# Patient Record
Sex: Male | Born: 1975 | Race: Black or African American | Hispanic: No | Marital: Single | Smoking: Current every day smoker
Health system: Southern US, Community
[De-identification: ages and names within clinical notes are randomized; demographics above are authoritative.]

---

## 2004-12-14 ENCOUNTER — Emergency Department (HOSPITAL_COMMUNITY): Admission: EM | Admit: 2004-12-14 | Discharge: 2004-12-14 | Payer: Self-pay | Admitting: Emergency Medicine

## 2005-03-01 ENCOUNTER — Emergency Department: Payer: Self-pay | Admitting: Emergency Medicine

## 2007-09-18 ENCOUNTER — Emergency Department: Payer: Self-pay | Admitting: Emergency Medicine

## 2012-04-30 ENCOUNTER — Emergency Department: Payer: Self-pay | Admitting: Emergency Medicine

## 2012-04-30 LAB — URINALYSIS, COMPLETE
Blood: NEGATIVE
Glucose,UR: NEGATIVE mg/dL (ref 0–75)
Nitrite: NEGATIVE
Protein: NEGATIVE

## 2012-05-01 LAB — URINE CULTURE

## 2016-03-20 ENCOUNTER — Emergency Department: Payer: Self-pay

## 2016-03-20 ENCOUNTER — Encounter: Payer: Self-pay | Admitting: Emergency Medicine

## 2016-03-20 ENCOUNTER — Emergency Department
Admission: EM | Admit: 2016-03-20 | Discharge: 2016-03-20 | Disposition: A | Discharge status: Home / Self Care | Payer: Self-pay | Attending: Student | Admitting: Student

## 2016-03-20 DIAGNOSIS — R0789 Other chest pain: Secondary | ICD-10-CM | POA: Insufficient documentation

## 2016-03-20 DIAGNOSIS — R0602 Shortness of breath: Secondary | ICD-10-CM | POA: Insufficient documentation

## 2016-03-20 DIAGNOSIS — F17200 Nicotine dependence, unspecified, uncomplicated: Secondary | ICD-10-CM | POA: Insufficient documentation

## 2016-03-20 LAB — BASIC METABOLIC PANEL
ANION GAP: 10 (ref 5–15)
BUN: 13 mg/dL (ref 6–20)
CALCIUM: 9.2 mg/dL (ref 8.9–10.3)
CO2: 25 mmol/L (ref 22–32)
CREATININE: 1.03 mg/dL (ref 0.61–1.24)
Chloride: 103 mmol/L (ref 101–111)
GFR calc non Af Amer: >60 mL/min (ref >60)
Glucose, Bld: 85 mg/dL (ref 65–99)
Potassium: 3.8 mmol/L (ref 3.5–5.1)
SODIUM: 138 mmol/L (ref 135–145)

## 2016-03-20 LAB — CBC
HCT: 41 % (ref 40.0–52.0)
Hemoglobin: 14.2 g/dL (ref 13.0–18.0)
MCH: 31.9 pg (ref 26.0–34.0)
MCHC: 34.7 g/dL (ref 32.0–36.0)
MCV: 92.1 fL (ref 80.0–100.0)
PLATELETS: 296 10*3/uL (ref 150–440)
RBC: 4.45 MIL/uL (ref 4.40–5.90)
RDW: 13 % (ref 11.5–14.5)
WBC: 5 10*3/uL (ref 3.8–10.6)

## 2016-03-20 LAB — TROPONIN I

## 2016-03-20 MED ORDER — IBUPROFEN 600 MG PO TABS
600.0000 mg | ORAL_TABLET | Freq: Once | ORAL | Status: AC
  Administered 2016-03-20: 600 mg via ORAL
  Filled 2016-03-20: qty 1

## 2016-03-20 MED ORDER — IBUPROFEN 600 MG PO TABS: 600.0000 mg | ORAL_TABLET | Freq: Three times a day (TID) | ORAL | 0 refills | Status: DC | PRN

## 2016-03-20 NOTE — ED Provider Notes (Signed)
[facility] Emergency Department Provider Note   ____________________________________________   First MD Initiated Contact with Patient 03/20/16 1251     (approximate)  I have reviewed the triage vital signs and the nursing notes.   HISTORY  Chief Complaint Chest Pain    HPI Neil Mitchell is a 40 y.o. male with no chronic medical problems who presents for evaluation of resolved left anterior superior chest wall pain today, noted on awakening this morning at 6:30 AM, initially moderate, now resolved, worse with movement of his neck. Patient reports that when he woke up this morning he was having mild soreness in the left upper chest which he attributed to "sleeping on it wrong". Pain has since resolved. He denies lightheadedness, syncope or presyncope.He denies any associated shortness of breath, the pain was not radiating, not pleuritic. Pain was not associated with any sudden sweating, nausea or vomiting though he did have some shortness of breath when his pain was at its maximum. He denies any family history of early coronary artery disease, no family history of sudden cardiac death. Denies any personal or family history of DVT or PE, exogenous estrogen use, recent surgeries, hemoptysis or recent prolonged period of immobilization.   History reviewed. No pertinent past medical history.  There are no active problems to display for this patient.   History reviewed. No pertinent surgical history.  Prior to Admission medications   Medication Sig Start Date End Date Taking? Authorizing Provider  ibuprofen (ADVIL,MOTRIN) 600 MG tablet Take 1 tablet (600 mg total) by mouth every 8 (eight) hours as needed for moderate pain. 03/20/16   Gayla Doss, MD    Allergies Review of patient's allergies indicates no known allergies.  History reviewed. No pertinent family history.  Social History Social History  Substance Use Topics  . Smoking status: Current  Every Day Smoker  . Smokeless tobacco: Never Used  . Alcohol use Yes    Review of Systems Constitutional: No fever/chills Eyes: No visual changes. ENT: No sore throat. Cardiovascular:+chest pain. Respiratory: Denies shortness of breath. Gastrointestinal: No abdominal pain.  No nausea, no vomiting.  No diarrhea.  No constipation. Genitourinary: Negative for dysuria. Musculoskeletal: Negative for back pain. Skin: Negative for rash. Neurological: Negative for headaches, focal weakness or numbness.  10-point ROS otherwise negative.  ____________________________________________   PHYSICAL EXAM:  VITAL SIGNS: ED Triage Vitals  Enc Vitals Group     BP 03/20/16 1002 116/82     Pulse Rate 03/20/16 1002 61     Resp 03/20/16 1002 20     Temp 03/20/16 1003 98.4 F (36.9 C)     Temp Source 03/20/16 1003 Oral     SpO2 03/20/16 1002 97 %     Weight 03/20/16 0948 143 lb (64.9 kg)     Height 03/20/16 0948 5\' 8"  (1.727 m)     Head Circumference --      Peak Flow --      Pain Score 03/20/16 0948 7     Pain Loc --      Pain Edu? --      Excl. in GC? --     Constitutional: Alert and oriented. Well appearing and in no acute distress. Eyes: Conjunctivae are normal. PERRL. EOMI. Head: Atraumatic. Nose: No congestion/rhinnorhea. Mouth/Throat: Mucous membranes are moist.  Oropharynx non-erythematous. Neck: No stridor.  Supple without meningismus. Cardiovascular: Normal rate, regular rhythm. Grossly normal heart sounds.  Good peripheral circulation. Respiratory: Normal respiratory effort.  No retractions. Lungs  CTAB. Gastrointestinal: Soft and nontender. No distention.  No CVA tenderness. Genitourinary: deferred Musculoskeletal: No lower extremity tenderness nor edema.  No joint effusions. Tenderness to palpation to left superior chest wall, surrounding the clavicle, tenderness to palpation in the left sternocleidomastoid muscles and the trapezius on the left. Neurologic:  Normal speech  and language. No gross focal neurologic deficits are appreciated. No gait instability. Skin:  Skin is warm, dry and intact. No rash noted. Psychiatric: Mood and affect are normal. Speech and behavior are normal.  ____________________________________________   LABS (all labs ordered are listed, but only abnormal results are displayed)  Labs Reviewed  BASIC METABOLIC PANEL  CBC  TROPONIN I   ____________________________________________  EKG  ED ECG REPORT I, Gayla DossGayle, [place] A, the attending physician, personally viewed and interpreted this ECG.   Date: 03/20/2016  EKG Time: 09:46  Rate: 57  Rhythm: sinus bradycardia  Axis: normal  Intervals:none  ST&T Change: No acute ST elevation or acute ST depression. RSR prime in V1 and V2, likely normal variant.  ____________________________________________  RADIOLOGY  CXR   IMPRESSION:  No active cardiopulmonary disease.       ____________________________________________   PROCEDURES  Procedure(s) performed: None  Procedures  Critical Care performed: No  ____________________________________________   INITIAL IMPRESSION / ASSESSMENT AND PLAN / ED COURSE  Pertinent labs & imaging results that were available during my care of the patient were reviewed by me and considered in my medical decision making (see chart for details).  Neil Mitchell is a 40 y.o. male with no chronic medical problems who presents for evaluation of resolved left anterior superior chest wall pain today, noted on awakening this morning at 6:30 AM, resolved. On exam, he is very well-appearing and in no acute distress, vital signs stable and he is afebrile. He appears to have reproducible point tenderness to palpation throughout the left anterior chest wall and I suspect musculoskeletal chest pain. His EKG is reassuring, not consistent with acute ischemia. I discussed the EKG with Dr. Welton FlakesKhan of cardiology who reviewed the EKG and agrees, RSR prime in V2,  not consistent with Brugada. His troponin is negative and was drawn several hours after the onset of this chest pain. Not consistent with ACS. Unremarkable CBC and BMP. Chest x-ray clear. PERC negative, not consistent with PE. Not consistent with acute aortic dissection. Discussed return precautions, need for close PCP follow-up, treatment with anti-inflammatory medications and he is comfortable with the discharge plan. DC home.  Clinical Course     ____________________________________________   FINAL CLINICAL IMPRESSION(S) / ED DIAGNOSES  Final diagnoses:  Chest wall pain      NEW MEDICATIONS STARTED DURING THIS VISIT:  Discharge Medication List as of 03/20/2016  1:43 PM    START taking these medications   Details  ibuprofen (ADVIL,MOTRIN) 600 MG tablet Take 1 tablet (600 mg total) by mouth every 8 (eight) hours as needed for moderate pain., Starting Tue 03/20/2016, Print         Note:  This document was prepared using Dragon voice recognition software and may include unintentional dictation errors.    Gayla DossEryka A Toni Hoffmeister, MD 03/20/16 [phone]

## 2016-03-20 NOTE — ED Triage Notes (Signed)
Pt to ed with c/o left side chest pain, radiating to left shoulder, left arm and left neck.  Pt also reports sob,  reports symptoms worse with movement.

## 2018-04-16 ENCOUNTER — Encounter: Payer: Self-pay | Admitting: *Deleted

## 2018-04-16 ENCOUNTER — Emergency Department
Admission: EM | Admit: 2018-04-16 | Discharge: 2018-04-16 | Disposition: A | Discharge status: Home / Self Care | Payer: Self-pay | Attending: Emergency Medicine | Admitting: Emergency Medicine

## 2018-04-16 ENCOUNTER — Other Ambulatory Visit: Payer: Self-pay

## 2018-04-16 DIAGNOSIS — Z711 Person with feared health complaint in whom no diagnosis is made: Secondary | ICD-10-CM

## 2018-04-16 DIAGNOSIS — F17200 Nicotine dependence, unspecified, uncomplicated: Secondary | ICD-10-CM | POA: Insufficient documentation

## 2018-04-16 DIAGNOSIS — Z202 Contact with and (suspected) exposure to infections with a predominantly sexual mode of transmission: Secondary | ICD-10-CM | POA: Insufficient documentation

## 2018-04-16 LAB — CHLAMYDIA/NGC RT PCR (ARMC ONLY)
CHLAMYDIA TR: NOT DETECTED
N gonorrhoeae: NOT DETECTED

## 2018-04-16 MED ORDER — METRONIDAZOLE 500 MG PO TABS: 2000.0000 mg | ORAL_TABLET | Freq: Once | ORAL | 0 refills | Status: AC

## 2018-04-16 NOTE — ED Provider Notes (Signed)
[facility] Emergency Department Provider Note ____________________________________________  Time seen: 1548  I have reviewed the triage vital signs and the nursing notes.  HISTORY  Chief Complaint  Exposure to STD  HPI Neil AmisBobby K Crespi is a 42 y.o. male who presents himself to the ED for evaluation of STD exposure. He reports that his girlfriend informed him today, that she had trichomoniasis. He denies any dysuria, penile discharge, hematuria, or penile lesions.   No past medical history on file.  There are no active problems to display for this patient.  No past surgical history on file.  Prior to Admission medications   Medication Sig Start Date End Date Taking? Authorizing Provider  ibuprofen (ADVIL,MOTRIN) 600 MG tablet Take 1 tablet (600 mg total) by mouth every 8 (eight) hours as needed for moderate pain. 03/20/16   Gayla DossGayle, Eryka A, MD  metroNIDAZOLE (FLAGYL) 500 MG tablet Take 4 tablets (2,000 mg total) by mouth once for 1 dose. 04/16/18 04/16/18  Elease Swarm, Charlesetta IvoryJenise V Bacon, PA-C   Allergies Patient has no known allergies.  No family history on file.  Social History Social History   Tobacco Use  . Smoking status: Current Every Day Smoker  . Smokeless tobacco: Never Used  Substance Use Topics  . Alcohol use: Yes  . Drug use: No   Review of Systems  Constitutional: Negative for fever. Eyes: Negative for visual changes. ENT: Negative for sore throat. Cardiovascular: Negative for chest pain. Respiratory: Negative for shortness of breath. Gastrointestinal: Negative for abdominal pain, vomiting and diarrhea. Genitourinary: Negative for dysuria, discharge, or penile lesions.  Musculoskeletal: Negative for back pain. Skin: Negative for rash. Neurological: Negative for headaches, focal weakness or numbness. ____________________________________________  PHYSICAL EXAM:  VITAL SIGNS: ED Triage Vitals [04/16/18 1534]  Enc Vitals Group     BP  135/90     Pulse Rate 73     Resp 20     Temp 98.6 F (37 C)     Temp Source Oral     SpO2 99 %     Weight 145 lb (65.8 kg)     Height 5\' 9"  (1.753 m)     Head Circumference      Peak Flow      Pain Score 0     Pain Loc      Pain Edu?      Excl. in GC?    Constitutional: Alert and oriented. Well appearing and in no distress. Head: Normocephalic and atraumatic. Gastroenterology: Abdomen soft and nontender. Normal bowel sounds.  GU: Deferred Cardiovascular: Normal rate, regular rhythm. Normal distal pulses. Respiratory: Normal respiratory effort. No wheezes/rales/rhonchi. Gastrointestinal: Soft and nontender. No distention. ____________________________________________   LABS (pertinent positives/negatives)  Labs Reviewed  CHLAMYDIA/NGC RT PCR ([facility] ONLY)  ____________________________________________  PROCEDURES  Procedures ____________________________________________  INITIAL IMPRESSION / ASSESSMENT AND PLAN / ED COURSE  Patient with ED evaluation following a potential STD exposure. He is asymptomatic with a concern for trich. [facility], and will be treated empirically with Flagyl. He will be notified via phone about his pending culture results. He will follow-up with the [facility] for further testing and treatment. He should avoid any sexual contact until he and his partner(s) have been treated and are symptom-free.  ____________________________________________  FINAL CLINICAL IMPRESSION(S) / ED DIAGNOSES  Final diagnoses:  STD exposure  Concern about STD in male without diagnosis     Karmen StabsMenshew, Charlesetta IvoryJenise V Bacon, PA-C 04/16/18 1830    Minna AntisPaduchowski, Kevin, MD  04/16/18 2327  

## 2018-04-16 NOTE — ED Notes (Signed)
Pt informed that we need urine sample to send to lab. Pt states he will notify us when he is able to provide sample

## 2018-04-16 NOTE — ED Triage Notes (Signed)
Pt states girlriend called him today and told him she had trichomonas   Pt here for treatment.  Pt denies penile discharge or urinary sx.

## 2018-04-16 NOTE — Discharge Instructions (Addendum)
Take the antibiotic as directed. You will be notified via phone, of your other pending lab results. Avoid unprotected sexual contact for at least 1 week. Be sure that you and your partner(s) have been treated and are completely symptom-free before any intimate activity. Follow-up with the [facility] Department for further testing and treatment.

## 2018-09-07 ENCOUNTER — Emergency Department
Admission: EM | Admit: 2018-09-07 | Discharge: 2018-09-07 | Disposition: A | Discharge status: Home / Self Care | Payer: Self-pay | Attending: Emergency Medicine | Admitting: Emergency Medicine

## 2018-09-07 ENCOUNTER — Emergency Department: Payer: Self-pay

## 2018-09-07 ENCOUNTER — Encounter: Payer: Self-pay | Admitting: Emergency Medicine

## 2018-09-07 ENCOUNTER — Other Ambulatory Visit: Payer: Self-pay

## 2018-09-07 DIAGNOSIS — S8002XA Contusion of left knee, initial encounter: Secondary | ICD-10-CM

## 2018-09-07 DIAGNOSIS — Y929 Unspecified place or not applicable: Secondary | ICD-10-CM | POA: Insufficient documentation

## 2018-09-07 DIAGNOSIS — M25462 Effusion, left knee: Secondary | ICD-10-CM

## 2018-09-07 DIAGNOSIS — S46912A Strain of unspecified muscle, fascia and tendon at shoulder and upper arm level, left arm, initial encounter: Secondary | ICD-10-CM

## 2018-09-07 DIAGNOSIS — Y939 Activity, unspecified: Secondary | ICD-10-CM | POA: Insufficient documentation

## 2018-09-07 DIAGNOSIS — Y999 Unspecified external cause status: Secondary | ICD-10-CM | POA: Insufficient documentation

## 2018-09-07 MED ORDER — MELOXICAM 7.5 MG PO TABS
15.0000 mg | ORAL_TABLET | Freq: Once | ORAL | Status: AC
Start: 2018-09-07 — End: 2018-09-07
  Administered 2018-09-07: 15 mg via ORAL
  Filled 2018-09-07: qty 2

## 2018-09-07 MED ORDER — METHOCARBAMOL 500 MG PO TABS: 500.0000 mg | ORAL_TABLET | Freq: Four times a day (QID) | ORAL | 0 refills | Status: DC

## 2018-09-07 MED ORDER — MELOXICAM 15 MG PO TABS
15.0000 mg | ORAL_TABLET | Freq: Every day | ORAL | 0 refills | Status: DC
Start: 2018-09-07 — End: 2021-03-14

## 2018-09-07 MED ORDER — HYDROCODONE-ACETAMINOPHEN 5-325 MG PO TABS: 1.0000 | ORAL_TABLET | ORAL | 0 refills | Status: DC | PRN
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
## 2018-09-07 NOTE — ED Triage Notes (Signed)
Pt arrives POV to triage with c/o mechanical fall on his left knee. Pt is in NAD.

## 2019-05-08 ENCOUNTER — Encounter: Payer: Self-pay | Admitting: Emergency Medicine

## 2019-05-08 ENCOUNTER — Other Ambulatory Visit: Payer: Self-pay

## 2019-05-08 ENCOUNTER — Emergency Department
Admission: EM | Admit: 2019-05-08 | Discharge: 2019-05-08 | Disposition: A | Discharge status: Home / Self Care | Payer: Self-pay | Attending: Emergency Medicine | Admitting: Emergency Medicine

## 2019-05-08 DIAGNOSIS — Z113 Encounter for screening for infections with a predominantly sexual mode of transmission: Secondary | ICD-10-CM | POA: Insufficient documentation

## 2019-05-08 DIAGNOSIS — F17210 Nicotine dependence, cigarettes, uncomplicated: Secondary | ICD-10-CM | POA: Insufficient documentation

## 2019-05-08 DIAGNOSIS — Z202 Contact with and (suspected) exposure to infections with a predominantly sexual mode of transmission: Secondary | ICD-10-CM | POA: Insufficient documentation

## 2019-05-08 LAB — URINALYSIS, COMPLETE (UACMP) WITH MICROSCOPIC
Bacteria, UA: NONE SEEN
Bilirubin Urine: NEGATIVE
Glucose, UA: NEGATIVE mg/dL
Hgb urine dipstick: NEGATIVE
Ketones, ur: NEGATIVE mg/dL
Leukocytes,Ua: NEGATIVE
Nitrite: NEGATIVE
Protein, ur: NEGATIVE mg/dL
Specific Gravity, Urine: 1.024 (ref 1.005–1.030)
pH: 6 (ref 5.0–8.0)

## 2019-05-08 MED ORDER — AZITHROMYCIN 500 MG PO TABS
1000.0000 mg | ORAL_TABLET | Freq: Once | ORAL | Status: AC
  Administered 2019-05-08: 16:00:00 1000 mg via ORAL
  Filled 2019-05-08: qty 2

## 2019-05-08 MED ORDER — CEFTRIAXONE SODIUM 250 MG IJ SOLR
250.0000 mg | Freq: Once | INTRAMUSCULAR | Status: AC
  Administered 2019-05-08: 16:00:00 250 mg via INTRAMUSCULAR
  Filled 2019-05-08: qty 250

## 2019-05-08 NOTE — ED Triage Notes (Signed)
Pt to ED via POV, pt states that his girl friend was diagnosed with Chlamydia and told him that he needed to be treated. Pt is in NAD.

## 2019-05-08 NOTE — ED Notes (Addendum)
Pt ambulatory from triage in NAD, wants STD testing for chlamydia, pt states he cannot urinate right now

## 2019-05-08 NOTE — ED Provider Notes (Signed)
[facility] Emergency Department Provider Note  ____________________________________________  Time seen: Approximately 3:50 PM  I have reviewed the triage vital signs and the nursing notes.   HISTORY  Chief Complaint STD check    HPI Neil Mitchell is a 43 y.o. male that presents to the emergency department requesting treatment for chlamydia.  Patient states that his girlfriend recently tested positive.  He denies any symptoms.  History reviewed. No pertinent past medical history.  There are no active problems to display for this patient.   History reviewed. No pertinent surgical history.  Prior to Admission medications   Medication Sig Start Date End Date Taking? Authorizing Provider  HYDROcodone-acetaminophen (NORCO/VICODIN) 5-325 MG tablet Take 1 tablet by mouth every 4 (four) hours as needed for moderate pain. 09/07/18   Cuthriell, Delorise Royals, PA-C  ibuprofen (ADVIL,MOTRIN) 600 MG tablet Take 1 tablet (600 mg total) by mouth every 8 (eight) hours as needed for moderate pain. 03/20/16   Gayla Doss, MD  meloxicam (MOBIC) 15 MG tablet Take 1 tablet (15 mg total) by mouth daily. 09/07/18   Cuthriell, Delorise Royals, PA-C  methocarbamol (ROBAXIN) 500 MG tablet Take 1 tablet (500 mg total) by mouth 4 (four) times daily. 09/07/18   Cuthriell, Delorise Royals, PA-C    Allergies Patient has no known allergies.  No family history on file.  Social History Social History   Tobacco Use  . Smoking status: Current Every Day Smoker    Packs/day: 0.25  . Smokeless tobacco: Never Used  Substance Use Topics  . Alcohol use: Yes  . Drug use: No     Review of Systems  Constitutional: No fever/chills Respiratory: No SOB. Gastrointestinal: No nausea, no vomiting.  Genitourinary: Negative for dysuria. Musculoskeletal: Negative for musculoskeletal pain. Skin: Negative for rash, abrasions, lacerations,  ecchymosis.   ____________________________________________   PHYSICAL EXAM:  VITAL SIGNS: ED Triage Vitals  Enc Vitals Group     BP 05/08/19 1516 (!) 137/93     Pulse Rate 05/08/19 1516 69     Resp 05/08/19 1516 16     Temp 05/08/19 1516 97.6 F (36.4 C)     Temp Source 05/08/19 1516 Oral     SpO2 05/08/19 1516 98 %     Weight 05/08/19 1515 142 lb (64.4 kg)     Height 05/08/19 1515 5\' 8"  (1.727 m)     Head Circumference --      Peak Flow --      Pain Score 05/08/19 1515 0     Pain Loc --      Pain Edu? --      Excl. in GC? --      Constitutional: Alert and oriented. Well appearing and in no acute distress. Eyes: Conjunctivae are normal. PERRL. EOMI. Head: Atraumatic. ENT:      Ears:      Nose: No congestion/rhinnorhea.      Mouth/Throat: Mucous membranes are moist.  Neck: No stridor.  Cardiovascular: Normal rate, regular rhythm.  Good peripheral circulation. Respiratory: Normal respiratory effort without tachypnea or retractions. Lungs CTAB. Good air entry to the bases with no decreased or absent breath sounds. Musculoskeletal: Full range of motion to all extremities. No gross deformities appreciated. Neurologic:  Normal speech and language. No gross focal neurologic deficits are appreciated.  Skin:  Skin is warm, dry and intact. No rash noted. Psychiatric: Mood and affect are normal. Speech and behavior are normal. Patient exhibits appropriate insight and judgement.   ____________________________________________  LABS (all labs ordered are listed, but only abnormal results are displayed)  Labs Reviewed  URINALYSIS, COMPLETE (UACMP) WITH MICROSCOPIC - Abnormal; Notable for the following components:      Result Value   Color, Urine YELLOW (*)    APPearance CLEAR (*)    All other components within normal limits  GC/CHLAMYDIA PROBE AMP   ____________________________________________  EKG   ____________________________________________  RADIOLOGY  No  results found.  ____________________________________________    PROCEDURES  Procedure(s) performed:    Procedures    Medications  cefTRIAXone (ROCEPHIN) injection 250 mg (250 mg Intramuscular Given 05/08/19 1612)  azithromycin (ZITHROMAX) tablet 1,000 mg (1,000 mg Oral Given 05/08/19 1612)     ____________________________________________   INITIAL IMPRESSION / ASSESSMENT AND PLAN / ED COURSE  Pertinent labs & imaging results that were available during my care of the patient were reviewed by me and considered in my medical decision making (see chart for details).  Review of the [place] CSRS was performed in accordance of the [place] prior to dispensing any controlled drugs.   Patient's diagnosis is consistent with STD exposure.  Patient was treated empirically for gonorrhea and chlamydia.   Patient is to follow up with health department as directed. Patient is given ED precautions to return to the ED for any worsening or new symptoms.   DOMINIE Mitchell was evaluated in Emergency Department on 05/08/2019 for the symptoms described in the history of present illness. He was evaluated in the context of the global COVID-19 pandemic, which necessitated consideration that the patient might be at risk for infection with the SARS-CoV-2 virus that causes COVID-19. Institutional protocols and algorithms that pertain to the evaluation of patients at risk for COVID-19 are in a state of rapid change based on information released by regulatory bodies including the CDC and federal and state organizations. These policies and algorithms were followed during the patient's care in the ED.  ____________________________________________  FINAL CLINICAL IMPRESSION(S) / ED DIAGNOSES  Final diagnoses:  STD exposure  Chlamydia contact      NEW MEDICATIONS STARTED DURING THIS VISIT:  ED Discharge Orders    None          This chart was dictated using voice recognition software/Dragon. Despite  best efforts to proofread, errors can occur which can change the meaning. Any change was purely unintentional.    Laban Emperor, PA-C 05/08/19 2028    Earleen Newport, MD 05/08/19 2158

## 2019-09-09 LAB — GC/CHLAMYDIA PROBE AMP
Chlamydia trachomatis, NAA: NEGATIVE
Neisseria Gonorrhoeae by PCR: NEGATIVE

## 2019-09-17 ENCOUNTER — Emergency Department
Admission: EM | Admit: 2019-09-17 | Discharge: 2019-09-17 | Disposition: A | Discharge status: Home / Self Care | Payer: Self-pay | Attending: Emergency Medicine | Admitting: Emergency Medicine

## 2019-09-17 ENCOUNTER — Other Ambulatory Visit: Payer: Self-pay

## 2019-09-17 ENCOUNTER — Emergency Department: Payer: Self-pay

## 2019-09-17 ENCOUNTER — Encounter: Payer: Self-pay | Admitting: Emergency Medicine

## 2019-09-17 DIAGNOSIS — M4722 Other spondylosis with radiculopathy, cervical region: Secondary | ICD-10-CM | POA: Insufficient documentation

## 2019-09-17 DIAGNOSIS — F17211 Nicotine dependence, cigarettes, in remission: Secondary | ICD-10-CM | POA: Insufficient documentation

## 2019-09-17 MED ORDER — METHYLPREDNISOLONE 4 MG PO TBPK: ORAL_TABLET | ORAL | 0 refills | Status: DC

## 2019-09-17 MED ORDER — CYCLOBENZAPRINE HCL 10 MG PO TABS: 10.0000 mg | ORAL_TABLET | Freq: Three times a day (TID) | ORAL | 0 refills | Status: DC | PRN

## 2019-09-17 MED ORDER — OXYCODONE-ACETAMINOPHEN 7.5-325 MG PO TABS: 1.0000 | ORAL_TABLET | Freq: Four times a day (QID) | ORAL | 0 refills | Status: DC | PRN

## 2019-09-17 NOTE — Discharge Instructions (Signed)
Follow discharge care instruction take medication as directed.  Advised to establish care with orthopedic department listed in your discharge care instructions.

## 2019-09-17 NOTE — ED Notes (Signed)
Pt states he's having neck pain that radiates into the neck. Pt denies any injury or any other pain at this time.

## 2019-09-17 NOTE — ED Triage Notes (Signed)
Presents with neck pain states pain started yesterday w/o injury

## 2019-09-17 NOTE — ED Provider Notes (Signed)
[facility] Emergency Department Provider Note   ____________________________________________   First MD Initiated Contact with Patient 09/17/19 1310     (approximate)  I have reviewed the triage vital signs and the nursing notes.   HISTORY  Chief Complaint Neck Pain    HPI Neil Mitchell is a 44 y.o. male patient presents with 2 days of posterior neck pain upon awakening.  Patient denies any provocative incident for complaint.  Patient does have radicular component to the right upper shoulder.  Patient also states pain radiates to the back of his head.  Patient denies vision disturbance or vertigo.  Patient denies weakness.  Patient rates pain as 7/10.  Patient described pain is "achy".  No palliative measure for complaint.         History reviewed. No pertinent past medical history.  There are no problems to display for this patient.   History reviewed. No pertinent surgical history.  Prior to Admission medications   Medication Sig Start Date End Date Taking? Authorizing Provider  cyclobenzaprine (FLEXERIL) 10 MG tablet Take 1 tablet (10 mg total) by mouth 3 (three) times daily as needed. 09/17/19   Joni Reining, PA-C  HYDROcodone-acetaminophen (NORCO/VICODIN) 5-325 MG tablet Take 1 tablet by mouth every 4 (four) hours as needed for moderate pain. 09/07/18   Cuthriell, Delorise Royals, PA-C  ibuprofen (ADVIL,MOTRIN) 600 MG tablet Take 1 tablet (600 mg total) by mouth every 8 (eight) hours as needed for moderate pain. 03/20/16   Gayla Doss, MD  meloxicam (MOBIC) 15 MG tablet Take 1 tablet (15 mg total) by mouth daily. 09/07/18   Cuthriell, Delorise Royals, PA-C  methocarbamol (ROBAXIN) 500 MG tablet Take 1 tablet (500 mg total) by mouth 4 (four) times daily. 09/07/18   Cuthriell, Delorise Royals, PA-C  methylPREDNISolone (MEDROL DOSEPAK) 4 MG TBPK tablet Take Tapered dose as directed 09/17/19   Joni Reining, PA-C  oxyCODONE-acetaminophen (PERCOCET) 7.5-325 MG  tablet Take 1 tablet by mouth every 6 (six) hours as needed for severe pain. 09/17/19   Joni Reining, PA-C    Allergies Patient has no known allergies.  No family history on file.  Social History Social History   Tobacco Use  . Smoking status: Current Every Day Smoker    Packs/day: 0.25  . Smokeless tobacco: Never Used  Substance Use Topics  . Alcohol use: Yes  . Drug use: No    Review of Systems Constitutional: No fever/chills Eyes: No visual changes. ENT: No sore throat. Cardiovascular: Denies chest pain. Respiratory: Denies shortness of breath. Gastrointestinal: No abdominal pain.  No nausea, no vomiting.  No diarrhea.  No constipation. Genitourinary: Negative for dysuria. Musculoskeletal: Positive for neck pain  skin: Negative for rash. Neurological: Negative for headaches, focal weakness or numbness. __________________________________________   PHYSICAL EXAM:  VITAL SIGNS: ED Triage Vitals  Enc Vitals Group     BP 09/17/19 1256 121/75     Pulse Rate 09/17/19 1256 86     Resp 09/17/19 1256 18     Temp 09/17/19 1256 98.7 F (37.1 C)     Temp Source 09/17/19 1256 Oral     SpO2 09/17/19 1256 99 %     Weight 09/17/19 1254 145 lb (65.8 kg)     Height 09/17/19 1254 5\' 8"  (1.727 m)     Head Circumference --      Peak Flow --      Pain Score 09/17/19 1253 7     Pain Loc --  Pain Edu? --      Excl. in [place]? --    Constitutional: Alert and oriented. Well appearing and in no acute distress. Neck: Moderate guarding with palpation C4-C6. Hematological/Lymphatic/Immunilogical: No cervical lymphadenopathy. Cardiovascular: Normal rate, regular rhythm. Grossly normal heart sounds.  Good peripheral circulation. Respiratory: Normal respiratory effort.  No retractions. Lungs CTAB. Musculoskeletal: No lower extremity tenderness nor edema.  No joint effusions. Neurologic:  Normal speech and language. No gross focal neurologic deficits are appreciated. No gait  instability. Skin:  Skin is warm, dry and intact. No rash noted. Psychiatric: Mood and affect are normal. Speech and behavior are normal.  ____________________________________________   LABS (all labs ordered are listed, but only abnormal results are displayed)  Labs Reviewed - No data to display ____________________________________________  EKG   ____________________________________________  RADIOLOGY  ED MD interpretation:    Official radiology report(s): DG Cervical Spine 2-3 Views  Result Date: 09/17/2019 CLINICAL DATA:  2 days of neck pain that increased with hyperextension. EXAM: CERVICAL SPINE - 2-3 VIEW COMPARISON:  No recent. FINDINGS: Diffuse multilevel degenerative change with loss of cervical lordosis. Degenerative changes most prominent at C5-C6 at which level there is prominent disc space loss and endplate osteophyte formation. No evidence of fracture dislocation. Pulmonary apices are clear. IMPRESSION: Diffuse multilevel degenerative change with loss of cervical lordosis. Degenerative changes most prominent C5-C6. No acute abnormality identified. Electronically Signed   By: Marcello Moores  Register   On: 09/17/2019 14:13    ____________________________________________   PROCEDURES  Procedure(s) performed (including Critical Care):  Procedures   ____________________________________________   INITIAL IMPRESSION / ASSESSMENT AND PLAN / ED COURSE  As part of my medical decision making, I reviewed the following data within the [place]     Patient presents with radicular neck pain to the right upper extremity.  Discussed x-ray findings with patient consistent with degenerative disc disease of the cervical spine.  Patient given discharge care instruction advised take medication as directed.  Patient advised follow-up with orthopedic department for definitive evaluation and treatment.    Neil Mitchell was evaluated in Emergency Department on  09/17/2019 for the symptoms described in the history of present illness. He was evaluated in the context of the global COVID-19 pandemic, which necessitated consideration that the patient might be at risk for infection with the SARS-CoV-2 virus that causes COVID-19. Institutional protocols and algorithms that pertain to the evaluation of patients at risk for COVID-19 are in a state of rapid change based on information released by regulatory bodies including the CDC and federal and state organizations. These policies and algorithms were followed during the patient's care in the ED.       ____________________________________________   FINAL CLINICAL IMPRESSION(S) / ED DIAGNOSES  Final diagnoses:  Osteoarthritis of spine with radiculopathy, cervical region     ED Discharge Orders         Ordered    methylPREDNISolone (MEDROL DOSEPAK) 4 MG TBPK tablet     09/17/19 1430    oxyCODONE-acetaminophen (PERCOCET) 7.5-325 MG tablet  Every 6 hours PRN     09/17/19 1430    cyclobenzaprine (FLEXERIL) 10 MG tablet  3 times daily PRN     09/17/19 1430           Note:  This document was prepared using Dragon voice recognition software and may include unintentional dictation errors.    Sable Feil, PA-C 09/17/19 1433    Carrie Mew, MD 09/17/19 1538

## 2020-09-01 ENCOUNTER — Emergency Department
Admission: EM | Admit: 2020-09-01 | Discharge: 2020-09-01 | Disposition: A | Discharge status: Home / Self Care | Payer: Self-pay | Attending: Emergency Medicine | Admitting: Emergency Medicine

## 2020-09-01 ENCOUNTER — Other Ambulatory Visit: Payer: Self-pay

## 2020-09-01 DIAGNOSIS — L298 Other pruritus: Secondary | ICD-10-CM | POA: Insufficient documentation

## 2020-09-01 DIAGNOSIS — R369 Urethral discharge, unspecified: Secondary | ICD-10-CM | POA: Insufficient documentation

## 2020-09-01 DIAGNOSIS — F17200 Nicotine dependence, unspecified, uncomplicated: Secondary | ICD-10-CM | POA: Insufficient documentation

## 2020-09-01 DIAGNOSIS — Z202 Contact with and (suspected) exposure to infections with a predominantly sexual mode of transmission: Secondary | ICD-10-CM

## 2020-09-01 LAB — CHLAMYDIA/NGC RT PCR (ARMC ONLY)
Chlamydia Tr: NOT DETECTED
N gonorrhoeae: NOT DETECTED

## 2020-09-01 LAB — URINALYSIS, COMPLETE (UACMP) WITH MICROSCOPIC
Bacteria, UA: NONE SEEN
Bilirubin Urine: NEGATIVE
Glucose, UA: NEGATIVE mg/dL
Ketones, ur: 5 mg/dL — AB
Leukocytes,Ua: NEGATIVE
Nitrite: NEGATIVE
Protein, ur: NEGATIVE mg/dL
Specific Gravity, Urine: 1.031 — ABNORMAL HIGH (ref 1.005–1.030)
pH: 5 (ref 5.0–8.0)

## 2020-09-01 NOTE — ED Provider Notes (Signed)
[facility] Emergency Department Provider Note   ____________________________________________   Event Date/Time   First MD Initiated Contact with Patient 09/01/20 1108     (approximate)  I have reviewed the triage vital signs and the nursing notes.   HISTORY  Chief Complaint SEXUALLY TRANSMITTED DISEASE    HPI Neil Mitchell is a 45 y.o. male patient presents with itching and penile discharge for 2 days.  Patient said his girlfriend is being treated for PID.  Patient last sexual contact was last week.         History reviewed. No pertinent past medical history.  There are no problems to display for this patient.   History reviewed. No pertinent surgical history.  Prior to Admission medications   Medication Sig Start Date End Date Taking? Authorizing Provider  cyclobenzaprine (FLEXERIL) 10 MG tablet Take 1 tablet (10 mg total) by mouth 3 (three) times daily as needed. 09/17/19   Joni Reining, PA-C  HYDROcodone-acetaminophen (NORCO/VICODIN) 5-325 MG tablet Take 1 tablet by mouth every 4 (four) hours as needed for moderate pain. 09/07/18   Cuthriell, Delorise Royals, PA-C  ibuprofen (ADVIL,MOTRIN) 600 MG tablet Take 1 tablet (600 mg total) by mouth every 8 (eight) hours as needed for moderate pain. 03/20/16   Gayla Doss, MD  meloxicam (MOBIC) 15 MG tablet Take 1 tablet (15 mg total) by mouth daily. 09/07/18   Cuthriell, Delorise Royals, PA-C  methocarbamol (ROBAXIN) 500 MG tablet Take 1 tablet (500 mg total) by mouth 4 (four) times daily. 09/07/18   Cuthriell, Delorise Royals, PA-C  methylPREDNISolone (MEDROL DOSEPAK) 4 MG TBPK tablet Take Tapered dose as directed 09/17/19   Joni Reining, PA-C  oxyCODONE-acetaminophen (PERCOCET) 7.5-325 MG tablet Take 1 tablet by mouth every 6 (six) hours as needed for severe pain. 09/17/19   Joni Reining, PA-C    Allergies Patient has no known allergies.  No family history on file.  Social History Social History    Tobacco Use  . Smoking status: Current Every Day Smoker    Packs/day: 0.25  . Smokeless tobacco: Never Used  Substance Use Topics  . Alcohol use: Yes  . Drug use: No    Review of Systems Constitutional: No fever/chills Eyes: No visual changes. ENT: No sore throat. Cardiovascular: Denies chest pain. Respiratory: Denies shortness of breath. Gastrointestinal: No abdominal pain.  No nausea, no vomiting.  No diarrhea.  No constipation. Genitourinary: Negative for dysuria.  Urethral discharge. Musculoskeletal: Negative for back pain. Skin: Negative for rash. Neurological: Negative for headaches, focal weakness or numbness.   ____________________________________________   PHYSICAL EXAM:  VITAL SIGNS: ED Triage Vitals  Enc Vitals Group     BP 09/01/20 1035 (!) 129/95     Pulse Rate 09/01/20 1035 91     Resp 09/01/20 1035 16     Temp 09/01/20 1035 98.2 F (36.8 C)     Temp Source 09/01/20 1035 Oral     SpO2 09/01/20 1035 96 %     Weight 09/01/20 1037 165 lb (74.8 kg)     Height 09/01/20 1037 5\' 8"  (1.727 m)     Head Circumference --      Peak Flow --      Pain Score 09/01/20 1037 0     Pain Loc --      Pain Edu? --      Excl. in GC? --     Constitutional: Alert and oriented. Well appearing and in no acute distress.  Cardiovascular: Normal rate, regular rhythm. Grossly normal heart sounds.  Good peripheral circulation. Respiratory: Normal respiratory effort.  No retractions. Lungs CTAB. Gastrointestinal: Soft and nontender. No distention. No abdominal bruits. No CVA tenderness. Genitourinary: No penile lesions or active discharge. Musculoskeletal: No lower extremity tenderness nor edema.  No joint effusions. Neurologic:  Normal speech and language. No gross focal neurologic deficits are appreciated. No gait instability. Skin:  Skin is warm, dry and intact. No rash noted. Psychiatric: Mood and affect are normal. Speech and behavior are  normal.  ____________________________________________   LABS (all labs ordered are listed, but only abnormal results are displayed)  Labs Reviewed  URINALYSIS, COMPLETE (UACMP) WITH MICROSCOPIC - Abnormal; Notable for the following components:      Result Value   Color, Urine YELLOW (*)    APPearance HAZY (*)    Specific Gravity, Urine 1.031 (*)    Hgb urine dipstick SMALL (*)    Ketones, ur 5 (*)    All other components within normal limits  CHLAMYDIA/NGC RT PCR ([facility] ONLY)   ____________________________________________  EKG   ____________________________________________  RADIOLOGY I, Joni Reining, personally viewed and evaluated these images (plain radiographs) as part of my medical decision making, as well as reviewing the written report by the radiologist.  ED MD interpretation:    Official radiology report(s): No results found.  ____________________________________________   PROCEDURES  Procedure(s) performed (including Critical Care):  Procedures   ____________________________________________   INITIAL IMPRESSION / ASSESSMENT AND PLAN / ED COURSE  As part of my medical decision making, I reviewed the following data within the electronic MEDICAL RECORD NUMBER         Patient request evaluation for possible exposure to STD.  Patient states penile itching and discharge for 2 days.  Discussed lab results with patient and were negative for chlamydia and gonorrhea.  Advised patient follow-up with [facility] department.      ____________________________________________   FINAL CLINICAL IMPRESSION(S) / ED DIAGNOSES  Final diagnoses:  Possible exposure to STD     ED Discharge Orders    None      *Please note:  Neil Mitchell was evaluated in Emergency Department on 09/01/2020 for the symptoms described in the history of present illness. He was evaluated in the context of the global COVID-19 pandemic, which necessitated  consideration that the patient might be at risk for infection with the SARS-CoV-2 virus that causes COVID-19. Institutional protocols and algorithms that pertain to the evaluation of patients at risk for COVID-19 are in a state of rapid change based on information released by regulatory bodies including the CDC and federal and state organizations. These policies and algorithms were followed during the patient's care in the ED.  Some ED evaluations and interventions may be delayed as a result of limited staffing during and the pandemic.*   Note:  This document was prepared using Dragon voice recognition software and may include unintentional dictation errors.    Joni Reining, PA-C 09/01/20 1338    Concha Se, MD 09/02/20 [phone]

## 2020-09-01 NOTE — ED Triage Notes (Signed)
Pt c/o having itching and penile discharge for the past 2 days, states his girlf friend is being treated for PID.

## 2020-09-01 NOTE — Discharge Instructions (Addendum)
Your lab results are negative for for chlamydia gonorrhea.

## 2021-01-30 IMAGING — CR DG CERVICAL SPINE 2 OR 3 VIEWS
1 series · 4 of 4 positions shown · non-contrast
Comparison: No recent.

CLINICAL DATA: 2 days of neck pain that increased with
hyperextension.

EXAM:
CERVICAL SPINE - 2-3 VIEW

[Series 1: dg cervical spine 2 or 3 views · 0.14mm/px · 4 of 4 slices shown]
[im 1/4]
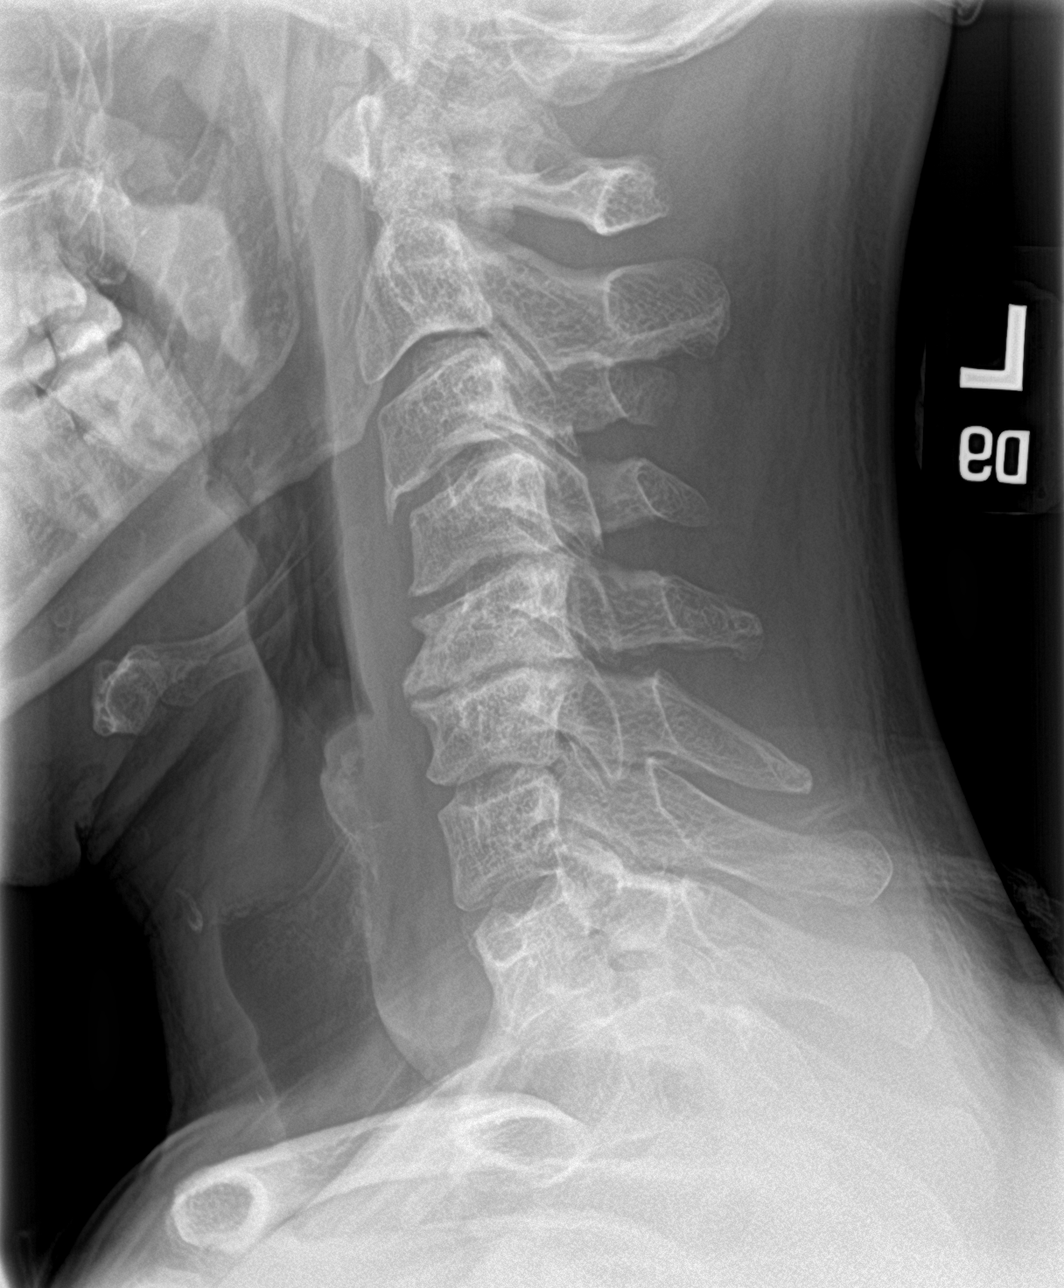
[im 2/4]
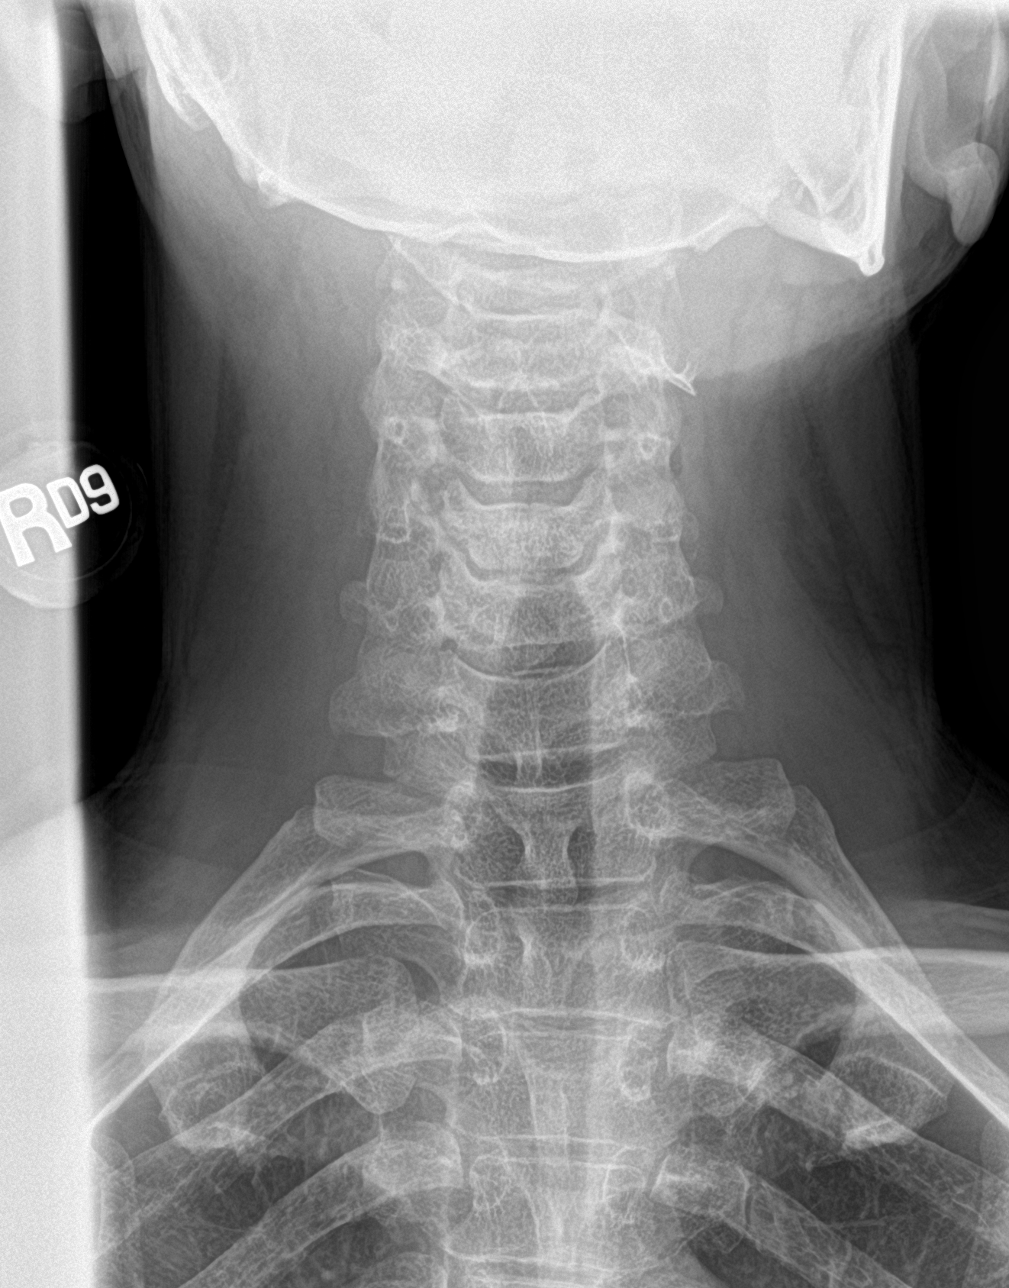
[im 3/4]
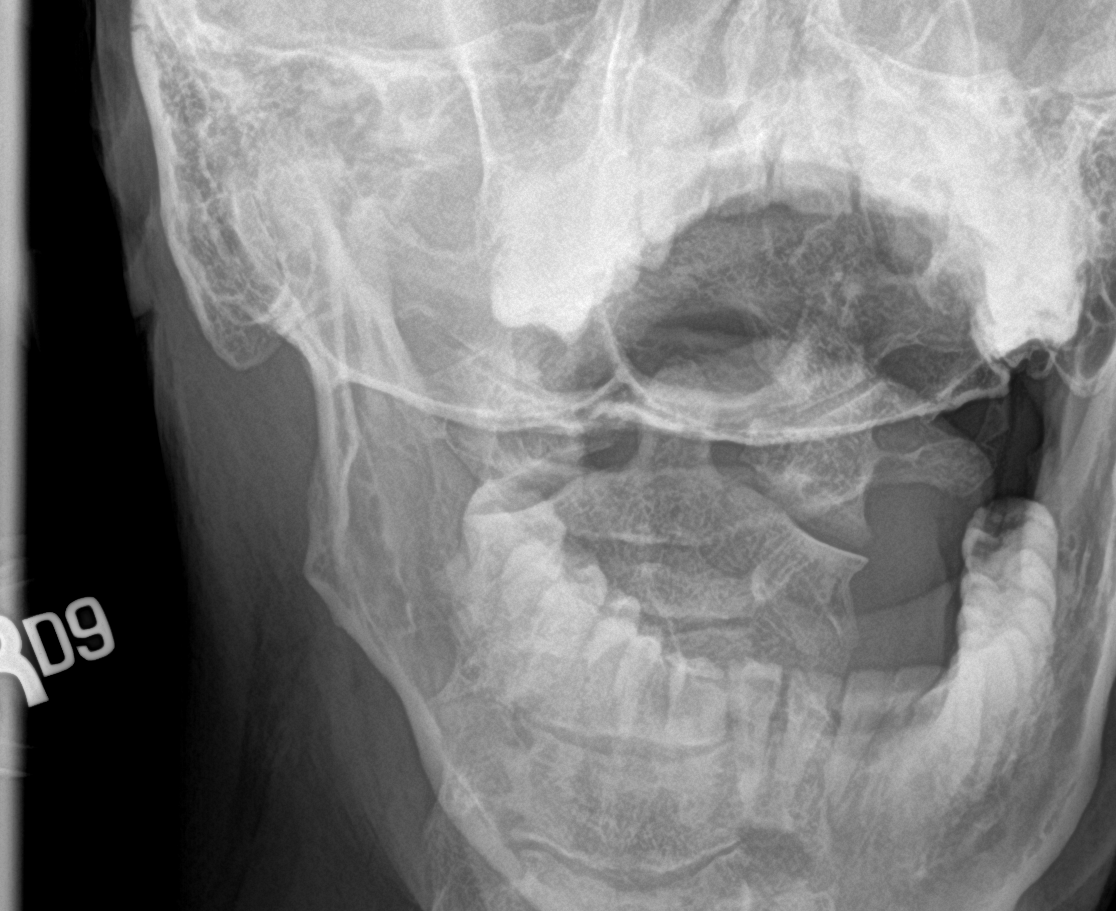
[im 4/4]
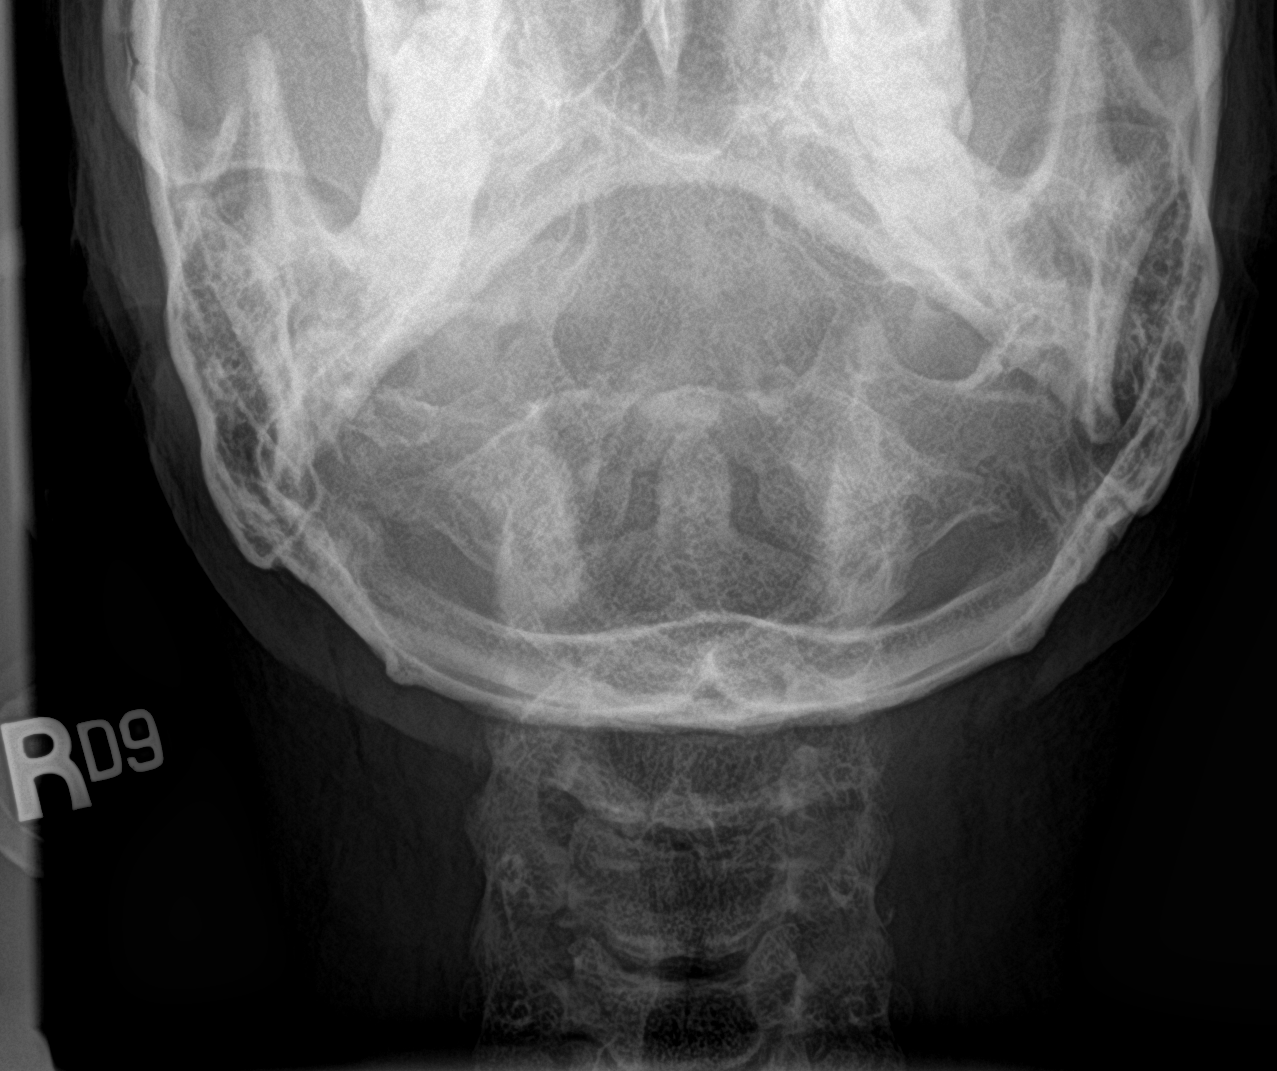

[4 of 4 positions shown; findings below may reference images not displayed]

FINDINGS: Diffuse multilevel degenerative change with loss of cervical
lordosis. Degenerative changes most prominent at C5-C6 at which
level there is prominent disc space loss and endplate osteophyte
formation. No evidence of fracture dislocation. Pulmonary apices are
clear.
IMPRESSION: Diffuse multilevel degenerative change with loss of cervical
lordosis. Degenerative changes most prominent C5-C6. No acute
abnormality identified.

## 2021-03-14 ENCOUNTER — Emergency Department
Admission: EM | Admit: 2021-03-14 | Discharge: 2021-03-14 | Disposition: A | Discharge status: Home / Self Care | Payer: Self-pay | Attending: Student in an Organized Health Care Education/Training Program | Admitting: Student in an Organized Health Care Education/Training Program

## 2021-03-14 ENCOUNTER — Other Ambulatory Visit: Payer: Self-pay

## 2021-03-14 DIAGNOSIS — L03116 Cellulitis of left lower limb: Secondary | ICD-10-CM | POA: Insufficient documentation

## 2021-03-14 DIAGNOSIS — F17210 Nicotine dependence, cigarettes, uncomplicated: Secondary | ICD-10-CM | POA: Insufficient documentation

## 2021-03-14 MED ORDER — DOXYCYCLINE HYCLATE 100 MG PO TABS: 100.0000 mg | ORAL_TABLET | Freq: Two times a day (BID) | ORAL | 0 refills | Status: AC

## 2021-03-14 NOTE — ED Triage Notes (Signed)
Pt states he woke up with a scab on his left knee Saturday "I think a spider bit me". States since it has been swollen and redness with some drainage'

## 2021-03-14 NOTE — ED Notes (Signed)
E-signature pad unavailable - Pt verbalized understanding of D/C information - no additional concerns at this time.  

## 2021-03-14 NOTE — ED Notes (Signed)
See triage note  Presents with possible insect bite to left knee

## 2021-03-14 NOTE — ED Provider Notes (Signed)
[facility] Emergency Department Provider Note    Event Date/Time   First MD Initiated Contact with Patient 03/14/21 1845     (approximate)  I have reviewed the triage vital signs and the nursing notes.   HISTORY  Chief Complaint Abscess    HPI Neil Mitchell is a 45 y.o. male no significant past medical history presents to the ER for evaluation of area of redness and pain to left lower leg and thinks that he was bit by a spider over the weekend.  States that he did notice a scab scraped it off and had some purulent drainage from it is since resolved.  He is able to walk able to range the knee no measured fevers.  Does not recall any tick bite but is not certain.  Has not noted any knee swelling today.  History reviewed. No pertinent past medical history. No family history on file. History reviewed. No pertinent surgical history. There are no problems to display for this patient.     Prior to Admission medications   Medication Sig Start Date End Date Taking? Authorizing Provider  doxycycline (VIBRA-TABS) 100 MG tablet Take 1 tablet (100 mg total) by mouth 2 (two) times daily for 7 days. 03/14/21 03/21/21 Yes Willy Eddy, MD    Allergies Patient has no known allergies.    Social History Social History   Tobacco Use   Smoking status: Every Day    Packs/day: 0.25    Types: Cigarettes   Smokeless tobacco: Never  Substance Use Topics   Alcohol use: Yes   Drug use: No    Review of Systems Patient denies headaches, rhinorrhea, blurry vision, numbness, shortness of breath, chest pain, edema, cough, abdominal pain, nausea, vomiting, diarrhea, dysuria, fevers, rashes or hallucinations unless otherwise stated above in HPI. ____________________________________________   PHYSICAL EXAM:  VITAL SIGNS: Vitals:   03/14/21 1602  BP: 125/86  Pulse: 67  Resp: 17  Temp: 98.9 F (37.2 C)  SpO2: 97%    Constitutional: Alert and oriented.   Eyes: Conjunctivae are normal.  Head: Atraumatic. Nose: No congestion/rhinnorhea. Mouth/Throat: Mucous membranes are moist.   Neck: No stridor. Painless ROM.  Cardiovascular: Normal rate, regular rhythm. Grossly normal heart sounds.  Good peripheral circulation. Respiratory: Normal respiratory effort.  No retractions. Lungs CTAB. Gastrointestinal: Soft and nontender. No distention. No abdominal bruits. No CVA tenderness. Genitourinary:  Musculoskeletal: No lower extremity tenderness nor edema.  No joint effusions.  2 inch diameter area of erythema and warmth with area of previous scar likely source of drainage.  No fluctuance.  He is able to fully passively and actively range the knee joint. Neurologic:  Normal speech and language. No gross focal neurologic deficits are appreciated. No facial droop Skin:  Skin is warm, dry and intact. No rash noted. Psychiatric: Mood and affect are normal. Speech and behavior are normal.  ____________________________________________   LABS (all labs ordered are listed, but only abnormal results are displayed)  No results found for this or any previous visit (from the past 24 hour(s)). ____________________________________________ ____________________________________________  RADIOLOGY   ____________________________________________   PROCEDURES  Procedure(s) performed:  Procedures    Critical Care performed: no ____________________________________________   INITIAL IMPRESSION / ASSESSMENT AND PLAN / ED COURSE  Pertinent labs & imaging results that were available during my care of the patient were reviewed by me and considered in my medical decision making (see chart for details).   DDX: Abscess, cellulitis, tickborne illness  [name]  is a 45 y.o. who presents to the ED with presentation as described above.  Patient clinically well-appearing in no acute distress.  Not consistent with abscess.  Does have a small area of cellulitis  also possible tickborne illness therefore after discussion with patient we will cover with doxycycline for cellulitis as well as tickborne illnesses.  Is not consistent with foreign body or fracture.  Not consistent with septic arthritis.  Patient stable and appropriate for outpatient follow-up.     The patient was evaluated in Emergency Department today for the symptoms described in the history of present illness. He/she was evaluated in the context of the global COVID-19 pandemic, which necessitated consideration that the patient might be at risk for infection with the SARS-CoV-2 virus that causes COVID-19. Institutional protocols and algorithms that pertain to the evaluation of patients at risk for COVID-19 are in a state of rapid change based on information released by regulatory bodies including the CDC and federal and state organizations. These policies and algorithms were followed during the patient's care in the ED.  As part of my medical decision making, I reviewed the following data within the electronic MEDICAL RECORD NUMBER Nursing notes reviewed and incorporated, Labs reviewed, notes from prior ED visits and [place] Controlled Substance Database   ____________________________________________   FINAL CLINICAL IMPRESSION(S) / ED DIAGNOSES  Final diagnoses:  Cellulitis of left lower extremity      NEW MEDICATIONS STARTED DURING THIS VISIT:  New Prescriptions   DOXYCYCLINE (VIBRA-TABS) 100 MG TABLET    Take 1 tablet (100 mg total) by mouth 2 (two) times daily for 7 days.     Note:  This document was prepared using Dragon voice recognition software and may include unintentional dictation errors.    Willy Eddy, MD 03/14/21 [phone]

## 2023-01-01 ENCOUNTER — Other Ambulatory Visit: Payer: Self-pay

## 2023-01-01 ENCOUNTER — Emergency Department
Admission: EM | Admit: 2023-01-01 | Discharge: 2023-01-01 | Disposition: A | Discharge status: Home / Self Care | Payer: Self-pay | Attending: Emergency Medicine | Admitting: Emergency Medicine

## 2023-01-01 DIAGNOSIS — Z711 Person with feared health complaint in whom no diagnosis is made: Secondary | ICD-10-CM | POA: Insufficient documentation

## 2023-01-01 DIAGNOSIS — D229 Melanocytic nevi, unspecified: Secondary | ICD-10-CM | POA: Insufficient documentation

## 2023-01-01 NOTE — ED Provider Notes (Signed)
   [facility] Provider Note    Event Date/Time   First MD Initiated Contact with Patient 01/01/23 1747     (approximate)   History   Tick Removal   HPI  Neil Mitchell is a 47 y.o. male  with no significant past medical history and as listed in EMR presents to the emergency department for treatment and evaluation of tick bites and tick buried in left chest wall he has been unable to pull off. No rash or fever..      Physical Exam   Triage Vital Signs: ED Triage Vitals  Enc Vitals Group     BP 01/01/23 1713 (!) 125/90     Pulse Rate 01/01/23 1713 75     Resp 01/01/23 1713 16     Temp 01/01/23 1713 98.5 F (36.9 C)     Temp Source 01/01/23 1713 Oral     SpO2 01/01/23 1713 95 %     Weight 01/01/23 1712 151 lb 14.4 oz (68.9 kg)     Height 01/01/23 1712 5\' 9"  (1.753 m)     Head Circumference --      Peak Flow --      Pain Score 01/01/23 1712 0     Pain Loc --      Pain Edu? --      Excl. in GC? --     Most recent vital signs: Vitals:   01/01/23 1713  BP: (!) 125/90  Pulse: 75  Resp: 16  Temp: 98.5 F (36.9 C)  SpO2: 95%    General: Awake, no distress.  CV:  Good peripheral perfusion.  Resp:  Normal effort.  Abd:  No distention.  Other:  2mm dark mole just under left areola.    ED Results / Procedures / Treatments   Labs (all labs ordered are listed, but only abnormal results are displayed) Labs Reviewed - No data to display   EKG  Not indicated.   RADIOLOGY  Image and radiology report reviewed and interpreted by me. Radiology report consistent with the same.  Not indicated.  PROCEDURES:  Critical Care performed: No  Procedures   MEDICATIONS ORDERED IN ED:  Medications - No data to display   IMPRESSION / MDM / ASSESSMENT AND PLAN / ED COURSE   I have reviewed the triage note.  Differential diagnosis includes, but is not limited to, tick bite, mole, Lymes disease  Patient's presentation is most  consistent with uncomplicated illness.  47 year old male presents to the ER for tick removal. No tick identified in the area of concern. Instead, he has a small dark mole similar to others on the chest. Magnifying lamp used to make sure it wasn't a tick.   Patient discharged home. He was advised he could use hydrocortisone cream on the other areas he removed ticks if they itch.      FINAL CLINICAL IMPRESSION(S) / ED DIAGNOSES   Final diagnoses:  No problem, feared complaint unfounded     Rx / DC Orders   ED Discharge Orders     None        Note:  This document was prepared using Dragon voice recognition software and may include unintentional dictation errors.   Chinita Pester, FNP 01/01/23 Junius Roads, MD 01/01/23 2031

## 2023-01-01 NOTE — ED Triage Notes (Signed)
C/o tic bites.  One small tic to left chest.

## 2023-01-06 ENCOUNTER — Emergency Department (HOSPITAL_COMMUNITY)
Admission: EM | Admit: 2023-01-06 | Discharge: 2023-01-06 | Disposition: A | Discharge status: Home / Self Care | Payer: No Typology Code available for payment source | Attending: Emergency Medicine | Admitting: Emergency Medicine

## 2023-01-06 ENCOUNTER — Other Ambulatory Visit: Payer: Self-pay

## 2023-01-06 ENCOUNTER — Encounter (HOSPITAL_COMMUNITY): Payer: Self-pay | Admitting: Emergency Medicine

## 2023-01-06 ENCOUNTER — Emergency Department (HOSPITAL_COMMUNITY): Payer: No Typology Code available for payment source

## 2023-01-06 DIAGNOSIS — Y92410 Unspecified street and highway as the place of occurrence of the external cause: Secondary | ICD-10-CM | POA: Diagnosis not present

## 2023-01-06 DIAGNOSIS — M25512 Pain in left shoulder: Secondary | ICD-10-CM | POA: Diagnosis present

## 2023-01-06 DIAGNOSIS — S40012A Contusion of left shoulder, initial encounter: Secondary | ICD-10-CM | POA: Diagnosis not present

## 2023-01-06 DIAGNOSIS — S161XXA Strain of muscle, fascia and tendon at neck level, initial encounter: Secondary | ICD-10-CM

## 2023-01-06 MED ORDER — ACETAMINOPHEN 500 MG PO TABS
1000.0000 mg | ORAL_TABLET | Freq: Once | ORAL | Status: AC
  Administered 2023-01-06: 1000 mg via ORAL
  Filled 2023-01-06: qty 2

## 2023-01-06 MED ORDER — METHOCARBAMOL 750 MG PO TABS: 750.0000 mg | ORAL_TABLET | Freq: Three times a day (TID) | ORAL | 0 refills | Status: AC | PRN
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
## 2023-01-06 NOTE — Discharge Instructions (Addendum)
It was our pleasure to provide your ER care today - we hope that you feel better.  Take acetaminophen or ibuprofen as need for pain. You may also take robaxin as need for muscle pain/spasm - no driving when taking.   Follow up with primary care doctor in 1-2 weeks if symptoms fail to improve/resolve.  Return to ER if worse, new/severe pain, chest pain, trouble breathing, numbness/weakness, or other concern.  
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
